# Patient Record
Sex: Female | Born: 1995 | Race: Black or African American | Hispanic: No | Marital: Single | Smoking: Never smoker
Health system: Southern US, Community
[De-identification: ages and names within clinical notes are randomized; demographics above are authoritative.]

## PROBLEM LIST (undated history)

## (undated) DIAGNOSIS — R748 Abnormal levels of other serum enzymes: Secondary | ICD-10-CM

## (undated) HISTORY — DX: Abnormal levels of other serum enzymes: R74.8

---
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
## 2015-07-14 ENCOUNTER — Other Ambulatory Visit: Payer: Self-pay | Admitting: Family Medicine

## 2015-07-14 ENCOUNTER — Ambulatory Visit
Admission: RE | Admit: 2015-07-14 | Discharge: 2015-07-14 | Disposition: A | Discharge status: Home / Self Care | Payer: BLUE CROSS/BLUE SHIELD | Source: Ambulatory Visit | Attending: Family Medicine | Admitting: Family Medicine

## 2015-07-14 DIAGNOSIS — S060X0A Concussion without loss of consciousness, initial encounter: Secondary | ICD-10-CM

## 2015-11-04 ENCOUNTER — Encounter (HOSPITAL_COMMUNITY): Payer: Self-pay | Admitting: Emergency Medicine

## 2015-11-04 ENCOUNTER — Emergency Department (HOSPITAL_COMMUNITY)
Admission: EM | Admit: 2015-11-04 | Discharge: 2015-11-04 | Disposition: A | Discharge status: Home / Self Care | Payer: BLUE CROSS/BLUE SHIELD | Attending: Emergency Medicine | Admitting: Emergency Medicine

## 2015-11-04 DIAGNOSIS — Z79818 Long term (current) use of other agents affecting estrogen receptors and estrogen levels: Secondary | ICD-10-CM | POA: Diagnosis not present

## 2015-11-04 DIAGNOSIS — T7840XA Allergy, unspecified, initial encounter: Secondary | ICD-10-CM | POA: Diagnosis present

## 2015-11-04 DIAGNOSIS — Y9389 Activity, other specified: Secondary | ICD-10-CM | POA: Insufficient documentation

## 2015-11-04 DIAGNOSIS — T781XXA Other adverse food reactions, not elsewhere classified, initial encounter: Secondary | ICD-10-CM | POA: Diagnosis not present

## 2015-11-04 DIAGNOSIS — X58XXXA Exposure to other specified factors, initial encounter: Secondary | ICD-10-CM | POA: Diagnosis not present

## 2015-11-04 DIAGNOSIS — R21 Rash and other nonspecific skin eruption: Secondary | ICD-10-CM | POA: Insufficient documentation

## 2015-11-04 DIAGNOSIS — Y998 Other external cause status: Secondary | ICD-10-CM | POA: Diagnosis not present

## 2015-11-04 DIAGNOSIS — Y9289 Other specified places as the place of occurrence of the external cause: Secondary | ICD-10-CM | POA: Insufficient documentation

## 2015-11-04 MED ORDER — PREDNISONE 20 MG PO TABS: 60.0000 mg | ORAL_TABLET | Freq: Once | ORAL | Status: DC

## 2015-11-04 MED ORDER — FAMOTIDINE 20 MG PO TABS: 20.0000 mg | ORAL_TABLET | Freq: Two times a day (BID) | ORAL | Status: DC

## 2015-11-04 MED ORDER — PREDNISONE 20 MG PO TABS: ORAL_TABLET | ORAL | Status: DC

## 2015-11-04 MED ORDER — EPINEPHRINE 0.3 MG/0.3ML IJ SOAJ: 0.3000 mg | Freq: Once | INTRAMUSCULAR | Status: DC

## 2015-11-04 MED ORDER — METHYLPREDNISOLONE SODIUM SUCC 125 MG IJ SOLR
125.0000 mg | Freq: Once | INTRAMUSCULAR | Status: AC
  Administered 2015-11-04: 125 mg via INTRAVENOUS
  Filled 2015-11-04: qty 2

## 2015-11-04 MED ORDER — FAMOTIDINE IN NACL 20-0.9 MG/50ML-% IV SOLN
20.0000 mg | Freq: Once | INTRAVENOUS | Status: AC
  Administered 2015-11-04: 20 mg via INTRAVENOUS
  Filled 2015-11-04: qty 50

## 2015-11-04 MED ORDER — DIPHENHYDRAMINE HCL 50 MG/ML IJ SOLN
25.0000 mg | Freq: Once | INTRAMUSCULAR | Status: AC
  Administered 2015-11-04: 25 mg via INTRAVENOUS
  Filled 2015-11-04: qty 1

## 2015-11-04 NOTE — ED Notes (Signed)
Pt given discharge instructions, verbalized understanding of need to follow up, reasons to return to the ED and medications to take at home. Pt denies further questions or needs. IV removed intact. Site clean and dry. VSS. Pt ambulated to exit without difficulty.

## 2015-11-04 NOTE — ED Notes (Addendum)
Patient c/o rash and itching to upper extremities, front torso, swelling to lips with numbness, and tingling in feet. Speaking in complete sentences, denies SOB, denies difficulty swallowing, denies oral swelling. Benadryl approximately 25mg  at 0100. Thinks she may have an allergy to egg products.

## 2015-11-04 NOTE — Discharge Instructions (Signed)

## 2015-11-04 NOTE — ED Provider Notes (Signed)
CSN: [id number]     Arrival date & time 11/04/15  0130 History   By signing my name below, I, [name], attest that this documentation has been prepared under the direction and in the presence of Makhya Arave, MD.  Electronically Signed: [name], ED Scribe. 11/04/2015. 2:56 AM.   Chief Complaint  Patient presents with  . Allergic Reaction   Patient is a 19 y.o. female presenting with allergic reaction. The history is provided by the patient. No language interpreter was used.  Allergic Reaction Presenting symptoms: itching, rash and swelling   Presenting symptoms: no difficulty breathing, no difficulty swallowing and no wheezing   Severity:  Moderate Prior allergic episodes:  Food/nut allergies Context: eggs   Context: no animal exposure, no chemicals, no cosmetics, no dairy/milk products, no grass, no insect bite/sting, no jewelry/metal, no medications, no new detergents/soaps, no nuts and no poison ivy   Relieved by:  Nothing Worsened by:  Nothing tried Ineffective treatments:  Antihistamines   HPI Comments: Tonya Morrow is a 19 y.o. female with a PMHx of eczema who presents to the Emergency Department complaining of an allergic reaction onset 6:00 AM this morning. She reports ongoing pruritis rash to the upper extremities, and front of torso. swelling and mild numbness also reported to lips and feet bilaterally. No aggravating or alleviating factors at this time. OTC Benadryl attempted prior to arrival without any improvement. No recent fever, chills, nausea, vomiting, chest pain, shortness of breath, trouble speaking, or trouble swallowing. No new medications, soaps, or cosmetic products. Pt attributes allergy to eggs as she reports a previous episode in the past after consuming eggs. Prior most recent symptoms, pt had eggs in the morning and in the evening for dinner followed by symptoms a few hours later. She denies any previous allergy testings.  PCP: Thora LanceEHINGER,ROBERT R,  MD    Past Medical History  Diagnosis Date  . Elevated liver enzymes    History reviewed. No pertinent past surgical history. Family History  Problem Relation Age of Onset  . Arthritis Maternal Aunt   . Liver disease Neg Hx    Social History  Substance Use Topics  . Smoking status: Never Smoker   . Smokeless tobacco: Never Used  . Alcohol Use: None   OB History    No data available     Review of Systems  Constitutional: Negative for fever and chills.  HENT: Negative for trouble swallowing.   Respiratory: Negative for cough, shortness of breath and wheezing.   Cardiovascular: Negative for chest pain.  Gastrointestinal: Negative for nausea, vomiting, abdominal pain and diarrhea.  Genitourinary: Negative for dysuria.  Musculoskeletal: Negative for back pain.  Skin: Positive for itching and rash.  Neurological: Negative for headaches.  Psychiatric/Behavioral: Negative for confusion.  All other systems reviewed and are negative.     Allergies  Review of patient's allergies indicates no known allergies.  Home Medications   Prior to Admission medications   Medication Sig Start Date End Date Taking? Authorizing Provider  diphenhydrAMINE (SOMINEX) 25 MG tablet Take 25 mg by mouth at bedtime as needed for itching, allergies or sleep.   Yes Historical Provider, MD  Etonogestrel (NEXPLANON [place]) Inject 1 each into the skin once.   Yes Historical Provider, MD   Triage Vitals: BP 132/79 mmHg  Pulse 83  Temp(Src) 97.8 F (36.6 C) (Oral)  Resp 20  SpO2 100%   Physical Exam  Constitutional: She is oriented to person, place, and time. She appears well-developed  and well-nourished. No distress.  HENT:  Head: Normocephalic and atraumatic.  Mouth/Throat: Oropharynx is clear and moist. No oropharyngeal exudate.  No swelling of the lips tongue or uvula  Eyes: EOM are normal.  Neck: Normal range of motion.  Cardiovascular: Normal rate, regular rhythm and normal heart sounds.    Pulmonary/Chest: Effort normal and breath sounds normal. No stridor.  Abdominal: Soft. Bowel sounds are normal. She exhibits no distension. There is no tenderness.  Musculoskeletal: Normal range of motion.  Neurological: She is alert and oriented to person, place, and time.  Skin: Skin is warm and dry. Rash noted.  Macular lesions on the extensor surface of arms spares the palms  Psychiatric: She has a normal mood and affect. Judgment normal.  Nursing note and vitals reviewed.   ED Course  Procedures (including critical care time)  DIAGNOSTIC STUDIES: Oxygen Saturation is 100% on RA, Normal by my interpretation.    COORDINATION OF CARE: 2:45 AM- Will give Pepcid, Benadryl, and Solu-Medrol. Discussed treatment plan with pt at bedside and pt agreed to plan.     Labs Review Labs Reviewed - No data to display  Imaging Review No results found. I have personally reviewed and evaluated these images and lab results as part of my medical decision-making.   EKG Interpretation None      MDM   Final diagnoses:  None    Will d/c with pepcid and steroids, list eggs as an allergy and discuss this with your doctor  I personally performed the services described in this documentation, which was scribed in my presence. The recorded information has been reviewed and is accurate.     Patient re-evaluated prior to dc, is hemodynamically stable, in no respiratory distress, and denies the feeling of throat closing. Pt has been advised to take OTC benadryl & return to the ED if they have a mod-severe allergic rxn (s/s including throat closing, difficulty breathing, swelling of lips face or tongue). Pt is to follow up with their PCP. Pt is agreeable with plan & verbalizes understanding.   Cy Blamer, MD 11/04/15 [phone]

## 2015-11-05 ENCOUNTER — Emergency Department (HOSPITAL_COMMUNITY)
Admission: EM | Admit: 2015-11-05 | Discharge: 2015-11-05 | Disposition: A | Discharge status: Home / Self Care | Payer: BLUE CROSS/BLUE SHIELD | Attending: Emergency Medicine | Admitting: Emergency Medicine

## 2015-11-05 ENCOUNTER — Encounter (HOSPITAL_COMMUNITY): Payer: Self-pay | Admitting: Emergency Medicine

## 2015-11-05 DIAGNOSIS — R21 Rash and other nonspecific skin eruption: Secondary | ICD-10-CM | POA: Diagnosis present

## 2015-11-05 DIAGNOSIS — T7840XA Allergy, unspecified, initial encounter: Secondary | ICD-10-CM | POA: Diagnosis not present

## 2015-11-05 DIAGNOSIS — Y998 Other external cause status: Secondary | ICD-10-CM | POA: Diagnosis not present

## 2015-11-05 DIAGNOSIS — Y9289 Other specified places as the place of occurrence of the external cause: Secondary | ICD-10-CM | POA: Insufficient documentation

## 2015-11-05 DIAGNOSIS — Y9389 Activity, other specified: Secondary | ICD-10-CM | POA: Insufficient documentation

## 2015-11-05 DIAGNOSIS — X58XXXA Exposure to other specified factors, initial encounter: Secondary | ICD-10-CM | POA: Diagnosis not present

## 2015-11-05 DIAGNOSIS — Z79899 Other long term (current) drug therapy: Secondary | ICD-10-CM | POA: Insufficient documentation

## 2015-11-05 MED ORDER — DIPHENHYDRAMINE HCL 50 MG/ML IJ SOLN
25.0000 mg | Freq: Once | INTRAMUSCULAR | Status: AC
  Administered 2015-11-05: 25 mg via INTRAVENOUS
  Filled 2015-11-05: qty 1

## 2015-11-05 MED ORDER — DEXAMETHASONE SODIUM PHOSPHATE 10 MG/ML IJ SOLN
10.0000 mg | Freq: Once | INTRAMUSCULAR | Status: AC
  Administered 2015-11-05: 10 mg via INTRAVENOUS
  Filled 2015-11-05: qty 1

## 2015-11-05 MED ORDER — FAMOTIDINE IN NACL 20-0.9 MG/50ML-% IV SOLN
20.0000 mg | Freq: Once | INTRAVENOUS | Status: AC
  Administered 2015-11-05: 20 mg via INTRAVENOUS
  Filled 2015-11-05: qty 50

## 2015-11-05 NOTE — ED Notes (Signed)
Pt denies swelling in throat but reports that she feels pain on deep inspiration.  Lung sounds are clear bilaterally and she does not appear to be in acute distress at this time.

## 2015-11-05 NOTE — Discharge Instructions (Signed)
We saw you in the ER for the allergic type reaction.  The reaction is mild and it appears to be in control as there is no swelling or any difficulty in breathing noted. We are not sure what caused the reaction, and it is important for you to follow up with a primary care doctor. Please take the medications prescribed. PLEASE RETURN TO THE ER IMMEDIATELY IN CASE YOU START HAVING WORSENING SWELLING, DIFFICULTY IN BREATHING ETC.   Allergy Skin Testing WHY AM I HAVING THIS TEST? Allergy skin testing is done to check whether you have an allergy to something. Testing may be done in one of two ways:  Injecting a small amount of the substance you may be allergic to (allergen).  Applying patches to your skin. Your health care provider will determine the results of your test by checking for an allergic reaction on the skin where the allergen was injected or where the patches were applied.  HOW DO I PREPARE FOR THE TEST?  Let your health care provider know about all medicines you are taking, including vitamins, herbs, eye drops, creams, and over-the-counter medicines. Some medicines can affect test results. Your health care provider will let you know when to stop taking those medicines and when you can begin taking them again.  If you are having a patch test:  Do not apply ointments, creams, or lotion to the skin where the patch will be placed. Usually, the patches are placed on your forearm or on your back.  Bring any items that you think you are allergic to, such as cosmetics, soaps, and perfume. WILL I NEED TO DO ANYTHING AT HOME? If you will receive an injection, you will not need to do anything at home. If patches will be applied to your skin, you will need to:  Wear them for 48 hours.  Return to your health care provider's office to have them removed. Do not remove them yourself.  Avoid bathing and activities that cause heavy sweating until after the patches are removed. WHAT ARE THE  REFERENCE RANGES? Reference ranges are considered healthy ranges established after testing a large group of healthy people. Reference ranges may vary among different people, labs, and hospitals.  The reference range for allergy skin testing is a swollen area of skin (wheal) less than 3mm in diameter, with surrounding redness and swelling (flare) less than 10mm in diameter.  WHAT DO THE RESULTS MEAN?  A result within the reference range means you are probably not allergic to the allergen.  A result in which the wheal is 3mm or more and the flare is 10mm or more means you are likely allergic to the allergen. Your health care provider will consider the results of your test in addition to your symptoms before diagnosing you with an allergy. Talk with your health care provider to discuss your results, treatment options, and if necessary, the need for more tests. Talk with your health care provider if you have any questions about your results.   This information is not intended to replace advice given to you by your health care provider. Make sure you discuss any questions you have with your health care provider.   Document Released: 12/19/2004 Document Revised: 12/17/2014 Document Reviewed: 09/07/2014 Elsevier Interactive Patient Education 2016 [employer]. Allergies An allergy is when your body reacts to a substance in a way that is not normal. An allergic reaction can happen after you:  Eat something.  Breathe in something.  Touch something. WHAT KINDS  OF ALLERGIES ARE THERE? You can be allergic to:  Things that are only around during certain seasons, like molds and pollens.  Foods.  Drugs.  Insects.  Animal dander. WHAT ARE SYMPTOMS OF ALLERGIES?  Puffiness (swelling). This may happen on the lips, face, tongue, mouth, or throat.  Sneezing.  Coughing.  Breathing loudly (wheezing).  Stuffy nose.  Tingling in the mouth.  A rash.  Itching.  Itchy, red, puffy areas of  skin (hives).  Watery eyes.  Throwing up (vomiting).  Watery poop (diarrhea).  Dizziness.  Feeling faint or fainting.  Trouble breathing or swallowing.  A tight feeling in the chest.  A fast heartbeat. HOW ARE ALLERGIES DIAGNOSED? Allergies can be diagnosed with:  A medical and family history.  Skin tests.  Blood tests.  A food diary. A food diary is a record of all the foods, drinks, and symptoms you have each day.  The results of an elimination diet. This diet involves making sure not to eat certain foods and then seeing what happens when you start eating them again. HOW ARE ALLERGIES TREATED? There is no cure for allergies, but allergic reactions can be treated with medicine. Severe reactions usually need to be treated at a hospital.  HOW CAN REACTIONS BE PREVENTED? The best way to prevent an allergic reaction is to avoid the thing you are allergic to. Allergy shots and medicines can also help prevent reactions in some cases.   This information is not intended to replace advice given to you by your health care provider. Make sure you discuss any questions you have with your health care provider.   Document Released: 03/23/2013 Document Revised: 12/17/2014 Document Reviewed: 09/07/2014 Elsevier Interactive Patient Education [employer] Inc[employer]

## 2015-11-05 NOTE — ED Notes (Signed)
Patient seen last night for allergic reaction. Patient states when she was discharged her symptoms were improved. Patient states at about 1800 her symptoms started again and have now worsened. Patient given rx for Prednisone, Famotidine that she reports shes been taking and also taking benadryl.

## 2015-11-05 NOTE — ED Notes (Signed)
Pt from home with allergic reaction. Pt has hives on her arms and she states she has been "itching all over". Pt states in assessment that her throat is hurting her, despite this, she is maintaining her airway and is 100% on RA. Pt states she does not know what causes her reaction, but was seen yesterday for it and was sent home with famotidine and benadryl along with prednisone. After leaving last night, pt states she felt better until around 1500 today when she started feeling symptoms again.

## 2015-11-05 NOTE — ED Provider Notes (Signed)
CSN: [id number]     Arrival date & time 11/05/15  0255 History   First MD Initiated Contact with Patient 11/05/15 [phone]     Chief Complaint  Patient presents with  . Allergic Reaction     (Consider location/radiation/quality/duration/timing/severity/associated sxs/prior Treatment) HPI Comments: Pt comes in with cc of allergic reaction. Reports that she started having sudden itching and skin redness yday. Seen in the ER and treated for allergic reaction. Reports that tonight she started getting increased itching despite taking her meds, so she came to the ER. Whilst waiting for my evaluation, her rash has gotten worse. She has no wheezing, dib, mouth or throat swelling. No known source. She has no autoimmune dz hx, arthrlagias. + eczema hx, but it is well controlled. Denies STD hx, travel hx, camping hx, new product use.  Patient is a 19 y.o. female presenting with allergic reaction. The history is provided by the patient.  Allergic Reaction Presenting symptoms: rash   Presenting symptoms: no wheezing     Past Medical History  Diagnosis Date  . Elevated liver enzymes    History reviewed. No pertinent past surgical history. Family History  Problem Relation Age of Onset  . Arthritis Maternal Aunt   . Liver disease Neg Hx    Social History  Substance Use Topics  . Smoking status: Never Smoker   . Smokeless tobacco: Never Used  . Alcohol Use: None   OB History    No data available     Review of Systems  Respiratory: Negative for shortness of breath and wheezing.   Cardiovascular: Negative for chest pain.  Skin: Positive for rash.  Allergic/Immunologic: Negative for environmental allergies, food allergies and immunocompromised state.      Allergies  Review of patient's allergies indicates no known allergies.  Home Medications   Prior to Admission medications   Medication Sig Start Date End Date Taking? Authorizing Provider  calamine lotion Apply 1 application  topically 3 (three) times daily as needed for itching.   Yes Historical Provider, MD  diphenhydrAMINE (SOMINEX) 25 MG tablet Take 25 mg by mouth at bedtime as needed for itching, allergies or sleep.   Yes Historical Provider, MD  EPINEPHrine 0.3 mg/0.3 mL IJ SOAJ injection Inject 0.3 mLs (0.3 mg total) into the muscle once. 11/04/15  Yes April Palumbo, MD  Etonogestrel [facility](NEXPLANON [place]) Inject 1 each into the skin once.   Yes Historical Provider, MD  famotidine (PEPCID) 20 MG tablet Take 1 tablet (20 mg total) by mouth 2 (two) times daily. 11/04/15  Yes April Palumbo, MD  Multiple Vitamin (MULTIVITAMIN WITH MINERALS) TABS tablet Take 1 tablet by mouth daily.   Yes Historical Provider, MD  Multiple Vitamins-Minerals (HAIR/SKIN/NAILS PO) Take 1 tablet by mouth daily.   Yes Historical Provider, MD  predniSONE (DELTASONE) 20 MG tablet 3 tabs po day one, then 2 po daily x 4 days Patient taking differently: Take 40-60 mg by mouth as directed. 3 tabs po day one, then 2 po daily x 4 days 11/04/15  Yes April Palumbo, MD  Probiotic Product (PROBIOTIC PO) Take 1 capsule by mouth daily.   Yes Historical Provider, MD   BP 118/62 mmHg  Pulse 83  Temp(Src) 99 F (37.2 C) (Oral)  Resp 16  Ht 5\' 4"  (1.626 m)  Wt 135 lb (61.236 kg)  BMI 23.16 kg/m2  SpO2 100%  LMP 11/03/2015 (Approximate) Physical Exam  Constitutional: She is oriented to person, place, and time. She appears well-developed.  HENT:  Head:  Normocephalic and atraumatic.  Eyes: EOM are normal.  Neck: Normal range of motion. Neck supple.  Cardiovascular: Normal rate.   Pulmonary/Chest: Effort normal.  Abdominal: Bowel sounds are normal.  Neurological: She is alert and oriented to person, place, and time.  Skin: Skin is warm and dry. Rash noted.  Diffuse erythematous urticarial lesion.  Nursing note and vitals reviewed.   ED Course  Procedures (including critical care time) Labs Review Labs Reviewed - No data to display  Imaging  Review No results found. I have personally reviewed and evaluated these images and lab results as part of my medical decision-making.   EKG Interpretation None      MDM   Final diagnoses:  Hypersensitivity, initial encounter    Pt comes in with skin rash and itching. She has what appears to be an urticarial lesion. No red flags suggesting anaphylaxis, angioedema, SJ syndrome, or systemic/ dissiminited rash from other condition. Strict ER return precautions discussed.   Derwood Kaplan, MD 11/05/15 [phone]

## 2018-07-11 DIAGNOSIS — R0789 Other chest pain: Secondary | ICD-10-CM | POA: Diagnosis not present

## 2018-07-11 DIAGNOSIS — M25511 Pain in right shoulder: Secondary | ICD-10-CM | POA: Diagnosis not present

## 2018-08-27 DIAGNOSIS — F411 Generalized anxiety disorder: Secondary | ICD-10-CM | POA: Diagnosis not present

## 2018-08-27 DIAGNOSIS — F410 Panic disorder [episodic paroxysmal anxiety] without agoraphobia: Secondary | ICD-10-CM | POA: Diagnosis not present

## 2018-08-29 DIAGNOSIS — N920 Excessive and frequent menstruation with regular cycle: Secondary | ICD-10-CM | POA: Diagnosis not present

## 2018-08-29 DIAGNOSIS — Z3046 Encounter for surveillance of implantable subdermal contraceptive: Secondary | ICD-10-CM | POA: Diagnosis not present

## 2018-08-29 DIAGNOSIS — D500 Iron deficiency anemia secondary to blood loss (chronic): Secondary | ICD-10-CM | POA: Diagnosis not present

## 2018-09-02 DIAGNOSIS — D509 Iron deficiency anemia, unspecified: Secondary | ICD-10-CM | POA: Diagnosis not present

## 2018-09-09 DIAGNOSIS — D509 Iron deficiency anemia, unspecified: Secondary | ICD-10-CM | POA: Diagnosis not present

## 2018-09-09 DIAGNOSIS — J029 Acute pharyngitis, unspecified: Secondary | ICD-10-CM | POA: Diagnosis not present

## 2018-09-23 DIAGNOSIS — D500 Iron deficiency anemia secondary to blood loss (chronic): Secondary | ICD-10-CM | POA: Diagnosis not present

## 2018-09-25 DIAGNOSIS — F410 Panic disorder [episodic paroxysmal anxiety] without agoraphobia: Secondary | ICD-10-CM | POA: Diagnosis not present

## 2018-09-25 DIAGNOSIS — F411 Generalized anxiety disorder: Secondary | ICD-10-CM | POA: Diagnosis not present

## 2018-10-02 DIAGNOSIS — S39012A Strain of muscle, fascia and tendon of lower back, initial encounter: Secondary | ICD-10-CM | POA: Diagnosis not present

## 2018-10-10 DIAGNOSIS — Z3046 Encounter for surveillance of implantable subdermal contraceptive: Secondary | ICD-10-CM | POA: Diagnosis not present

## 2018-11-10 DIAGNOSIS — K529 Noninfective gastroenteritis and colitis, unspecified: Secondary | ICD-10-CM | POA: Diagnosis not present

## 2019-03-27 DIAGNOSIS — L0231 Cutaneous abscess of buttock: Secondary | ICD-10-CM | POA: Diagnosis not present

## 2019-04-27 DIAGNOSIS — R0981 Nasal congestion: Secondary | ICD-10-CM | POA: Diagnosis not present

## 2019-04-27 DIAGNOSIS — R509 Fever, unspecified: Secondary | ICD-10-CM | POA: Diagnosis not present

## 2019-04-27 DIAGNOSIS — R52 Pain, unspecified: Secondary | ICD-10-CM | POA: Diagnosis not present

## 2019-05-06 ENCOUNTER — Other Ambulatory Visit: Payer: Self-pay | Admitting: Surgery

## 2019-05-06 DIAGNOSIS — L0501 Pilonidal cyst with abscess: Secondary | ICD-10-CM | POA: Diagnosis not present

## 2019-05-11 DIAGNOSIS — Z118 Encounter for screening for other infectious and parasitic diseases: Secondary | ICD-10-CM | POA: Diagnosis not present

## 2019-05-11 DIAGNOSIS — A5901 Trichomonal vulvovaginitis: Secondary | ICD-10-CM | POA: Diagnosis not present

## 2019-06-01 DIAGNOSIS — Z118 Encounter for screening for other infectious and parasitic diseases: Secondary | ICD-10-CM | POA: Diagnosis not present

## 2019-06-01 DIAGNOSIS — Z1159 Encounter for screening for other viral diseases: Secondary | ICD-10-CM | POA: Diagnosis not present

## 2019-06-01 DIAGNOSIS — Z114 Encounter for screening for human immunodeficiency virus [HIV]: Secondary | ICD-10-CM | POA: Diagnosis not present

## 2019-06-01 DIAGNOSIS — Z113 Encounter for screening for infections with a predominantly sexual mode of transmission: Secondary | ICD-10-CM | POA: Diagnosis not present

## 2019-06-01 DIAGNOSIS — B373 Candidiasis of vulva and vagina: Secondary | ICD-10-CM | POA: Diagnosis not present

## 2019-06-05 DIAGNOSIS — Z6828 Body mass index (BMI) 28.0-28.9, adult: Secondary | ICD-10-CM | POA: Diagnosis not present

## 2019-06-05 DIAGNOSIS — N760 Acute vaginitis: Secondary | ICD-10-CM | POA: Diagnosis not present

## 2019-06-05 DIAGNOSIS — Z1151 Encounter for screening for human papillomavirus (HPV): Secondary | ICD-10-CM | POA: Diagnosis not present

## 2019-06-05 DIAGNOSIS — Z118 Encounter for screening for other infectious and parasitic diseases: Secondary | ICD-10-CM | POA: Diagnosis not present

## 2019-06-05 DIAGNOSIS — Z01419 Encounter for gynecological examination (general) (routine) without abnormal findings: Secondary | ICD-10-CM | POA: Diagnosis not present

## 2019-06-19 DIAGNOSIS — Z1159 Encounter for screening for other viral diseases: Secondary | ICD-10-CM | POA: Diagnosis not present

## 2019-06-25 DIAGNOSIS — L0591 Pilonidal cyst without abscess: Secondary | ICD-10-CM | POA: Diagnosis not present

## 2019-07-07 DIAGNOSIS — Z20828 Contact with and (suspected) exposure to other viral communicable diseases: Secondary | ICD-10-CM | POA: Diagnosis not present

## 2019-07-07 DIAGNOSIS — F418 Other specified anxiety disorders: Secondary | ICD-10-CM | POA: Diagnosis not present

## 2019-08-14 DIAGNOSIS — N760 Acute vaginitis: Secondary | ICD-10-CM | POA: Diagnosis not present

## 2019-12-01 DIAGNOSIS — Z789 Other specified health status: Secondary | ICD-10-CM | POA: Diagnosis not present

## 2019-12-01 DIAGNOSIS — B373 Candidiasis of vulva and vagina: Secondary | ICD-10-CM | POA: Diagnosis not present

## 2020-02-20 DIAGNOSIS — Z20828 Contact with and (suspected) exposure to other viral communicable diseases: Secondary | ICD-10-CM | POA: Diagnosis not present

## 2020-02-20 DIAGNOSIS — Z03818 Encounter for observation for suspected exposure to other biological agents ruled out: Secondary | ICD-10-CM | POA: Diagnosis not present

## 2020-02-24 DIAGNOSIS — U071 COVID-19: Secondary | ICD-10-CM | POA: Diagnosis not present

## 2020-02-24 DIAGNOSIS — Z20828 Contact with and (suspected) exposure to other viral communicable diseases: Secondary | ICD-10-CM | POA: Diagnosis not present

## 2020-03-06 ENCOUNTER — Ambulatory Visit: Payer: Self-pay

## 2020-04-16 ENCOUNTER — Ambulatory Visit: Payer: Self-pay | Attending: Internal Medicine

## 2020-04-16 ENCOUNTER — Ambulatory Visit: Payer: Self-pay

## 2020-06-08 ENCOUNTER — Ambulatory Visit: Payer: BC Managed Care – PPO | Admitting: Obstetrics and Gynecology

## 2020-06-08 ENCOUNTER — Other Ambulatory Visit: Payer: Self-pay

## 2020-06-08 ENCOUNTER — Encounter: Payer: Self-pay | Admitting: Obstetrics and Gynecology

## 2020-06-08 VITALS — BP 122/77 | HR 79 | Ht 64.0 in | Wt 146.0 lb

## 2020-06-08 DIAGNOSIS — N760 Acute vaginitis: Secondary | ICD-10-CM | POA: Diagnosis not present

## 2020-06-08 DIAGNOSIS — Z113 Encounter for screening for infections with a predominantly sexual mode of transmission: Secondary | ICD-10-CM

## 2020-06-08 DIAGNOSIS — Z124 Encounter for screening for malignant neoplasm of cervix: Secondary | ICD-10-CM | POA: Diagnosis not present

## 2020-06-08 NOTE — Patient Instructions (Addendum)
[employer]  Re[employer] Pro-B

## 2020-06-08 NOTE — Progress Notes (Signed)
Obstetrics & Gynecology Office Visit   Chief Complaint:  Chief Complaint  Patient presents with  . Vaginitis    vaginal itching, discharge, no odor before cycles.     History of Present Illness: [name] is a 24 y.o. G0P0000 who LMP was Patient's last menstrual period was 05/14/2020., presents today for a problem visit.   Patient complains of an abnormal vaginal discharge off and on for the better part of last year. Discharge described as: white and thick. Vaginal symptoms include local irritation.   .Menstrual pattern: She had been bleeding irregularly. Contraception: Nexplanon.  She denies recent antibiotic exposure, denies changes in soaps, detergents coinciding with the onset of her symptoms.  She does feel symptoms are temporarily related to placement of her last Nexplanon and usually coincide with onset of any bleeding.   The patient does not have a personal history of diabetes, she is not on any immunosuppresive drugs, and she does not douche.    Review of Systems: Review of Systems  Constitutional: Negative.   Genitourinary: Negative.   Skin: Positive for itching.     Past Medical History:  Past Medical History:  Diagnosis Date  . Elevated liver enzymes     Past Surgical History:  Past Surgical History:  Procedure Laterality Date  . nexplanon    . Pilonidal cyst      Gynecologic History: Patient's last menstrual period was 05/14/2020.  Obstetric History: G0P0000  Family History:  Family History  Problem Relation Age of Onset  . Arthritis Maternal Aunt   . Liver disease Neg Hx     Social History:  Social History   Socioeconomic History  . Marital status: Single    Spouse name: Not on file  . Number of children: Not on file  . Years of education: Not on file  . Highest education level: Not on file  Occupational History  . Not on file  Tobacco Use  . Smoking status: Never Smoker  . Smokeless tobacco: Never Used  Vaping Use  . Vaping  Use: Never used  Substance and Sexual Activity  . Alcohol use: Yes  . Drug use: Never  . Sexual activity: Yes    Birth control/protection: Implant  Other Topics Concern  . Not on file  Social History Narrative  . Not on file   Social Determinants of Health   Financial Resource Strain:   . Difficulty of Paying Living Expenses:   Food Insecurity:   . Worried About Programme researcher, broadcasting/film/video in the Last Year:   . Barista in the Last Year:   Transportation Needs:   . Freight forwarder (Medical):   Marland Kitchen Lack of Transportation (Non-Medical):   Physical Activity:   . Days of Exercise per Week:   . Minutes of Exercise per Session:   Stress:   . Feeling of Stress :   Social Connections:   . Frequency of Communication with Friends and Family:   . Frequency of Social Gatherings with Friends and Family:   . Attends Religious Services:   . Active Member of Clubs or Organizations:   . Attends Banker Meetings:   Marland Kitchen Marital Status:   Intimate Partner Violence:   . Fear of Current or Ex-Partner:   . Emotionally Abused:   Marland Kitchen Physically Abused:   . Sexually Abused:     Allergies:  No Known Allergies  Medications: Prior to Admission medications   Medication Sig Start Date End  Date Taking? Authorizing Provider  Etonogestrel (NEXPLANON [place]) Inject 1 each into the skin once.   Yes [provider]    Physical Exam Vitals:  Vitals:   06/08/20 0906  BP: 122/77  Pulse: 79   Patient's last menstrual period was 05/14/2020.  General: NAD, well nourished, appears stated age HEENT: normocephalic, anicteric Pulmonary: No increased work of breathing Genitourinary:  External: Normal external female genitalia.  Normal urethral meatus, normal  Bartholin's and Skene's glands.    Vagina: Normal vaginal mucosa, no evidence of prolapse.    Cervix: Grossly normal in appearance, no bleeding  Uterus: Non-enlarged, mobile, normal contour.  No CMT  Adnexa: ovaries  non-enlarged, no adnexal masses  Rectal: deferred  Lymphatic: no evidence of inguinal lymphadenopathy Extremities: no edema, erythema, or tenderness Neurologic: Grossly intact Psychiatric: mood appropriate, affect full  Female chaperone present for pelvic  portions of the physical exam  Assessment: 24 y.o. G0P0000 recurrent candida vaginitis  Plan: Problem List Items Addressed This Visit    None    Visit Diagnoses    Screening for malignant neoplasm of cervix    -  Primary   Relevant Orders   IGP, rfx Aptima HPV ASCU   Routine screening for STI (sexually transmitted infection)       Relevant Orders   NuSwab Vaginitis Plus (VG+)   Recurrent vaginitis       Relevant Orders   NuSwab Vaginitis Plus (VG+)     1) Risk factors for bacterial vaginosis and candida infections discussed.  We discussed normal vaginal flora/microbiome.  Any factors that may alter the microbiome increase the risk of these opportunistic infections.  These include changes in pH, antibiotic exposures, diabetes, wet bathing suits etc.  We discussed that treatment is aimed at eradicating abnormal bacterial overgrowth and or yeast.  There may be some role for vaginal probiotics in restoring normal vaginal flora.   - nuswab - pap brought up to tdate - tx pending nuswab results  2) A total of 20 minutes were spent in face-to-face contact with the patient during this encounter with over half of that time devoted to counseling and coordination of care.  3) Return in about 1 year (around 06/08/2021), or if symptoms worsen or fail to improve, for Annual.   Vena Austria, MD, [facility], [facility] 06/08/2020, 9:44 AM

## 2020-06-11 LAB — NUSWAB VAGINITIS PLUS (VG+)
Candida albicans, NAA: POSITIVE — AB
Candida glabrata, NAA: NEGATIVE
Chlamydia trachomatis, NAA: NEGATIVE
Neisseria gonorrhoeae, NAA: NEGATIVE
Trich vag by NAA: NEGATIVE

## 2020-06-14 ENCOUNTER — Other Ambulatory Visit: Payer: Self-pay | Admitting: Obstetrics and Gynecology

## 2020-06-14 LAB — IGP, RFX APTIMA HPV ASCU: PAP Smear Comment: 0

## 2020-06-14 LAB — HPV APTIMA: HPV Aptima: NEGATIVE

## 2020-06-14 MED ORDER — FLUCONAZOLE 150 MG PO TABS: 150.0000 mg | ORAL_TABLET | Freq: Once | ORAL | 0 refills | Status: AC

## 2020-09-22 DIAGNOSIS — J029 Acute pharyngitis, unspecified: Secondary | ICD-10-CM | POA: Diagnosis not present

## 2020-09-22 DIAGNOSIS — R059 Cough, unspecified: Secondary | ICD-10-CM | POA: Diagnosis not present

## 2020-09-28 DIAGNOSIS — B373 Candidiasis of vulva and vagina: Secondary | ICD-10-CM | POA: Diagnosis not present

## 2020-09-28 DIAGNOSIS — J019 Acute sinusitis, unspecified: Secondary | ICD-10-CM | POA: Diagnosis not present

## 2020-10-03 DIAGNOSIS — Z0000 Encounter for general adult medical examination without abnormal findings: Secondary | ICD-10-CM | POA: Diagnosis not present

## 2020-10-03 DIAGNOSIS — N760 Acute vaginitis: Secondary | ICD-10-CM | POA: Diagnosis not present

## 2020-10-03 DIAGNOSIS — Z7689 Persons encountering health services in other specified circumstances: Secondary | ICD-10-CM | POA: Diagnosis not present

## 2020-10-03 DIAGNOSIS — Z23 Encounter for immunization: Secondary | ICD-10-CM | POA: Diagnosis not present

## 2020-10-03 DIAGNOSIS — Z113 Encounter for screening for infections with a predominantly sexual mode of transmission: Secondary | ICD-10-CM | POA: Diagnosis not present

## 2023-10-16 ENCOUNTER — Ambulatory Visit: Payer: Self-pay | Admitting: Dermatology
